# Patient Record
Sex: Male | Born: 1990 | Hispanic: No | Marital: Married
Health system: Southern US, Community
[De-identification: ages and names within clinical notes are randomized; demographics above are authoritative.]

---

## 2015-02-28 ENCOUNTER — Encounter (HOSPITAL_BASED_OUTPATIENT_CLINIC_OR_DEPARTMENT_OTHER): Payer: Self-pay | Admitting: Emergency Medicine

## 2015-02-28 ENCOUNTER — Emergency Department (HOSPITAL_BASED_OUTPATIENT_CLINIC_OR_DEPARTMENT_OTHER)

## 2015-02-28 ENCOUNTER — Emergency Department (HOSPITAL_BASED_OUTPATIENT_CLINIC_OR_DEPARTMENT_OTHER)
Admission: EM | Admit: 2015-02-28 | Discharge: 2015-02-28 | Disposition: A | Discharge status: Home / Self Care | Attending: Emergency Medicine | Admitting: Emergency Medicine

## 2015-02-28 DIAGNOSIS — Y9389 Activity, other specified: Secondary | ICD-10-CM | POA: Diagnosis not present

## 2015-02-28 DIAGNOSIS — Y991 Military activity: Secondary | ICD-10-CM | POA: Diagnosis not present

## 2015-02-28 DIAGNOSIS — Y9289 Other specified places as the place of occurrence of the external cause: Secondary | ICD-10-CM | POA: Insufficient documentation

## 2015-02-28 DIAGNOSIS — S2341XA Sprain of ribs, initial encounter: Secondary | ICD-10-CM | POA: Insufficient documentation

## 2015-02-28 DIAGNOSIS — S29001A Unspecified injury of muscle and tendon of front wall of thorax, initial encounter: Secondary | ICD-10-CM | POA: Diagnosis present

## 2015-02-28 DIAGNOSIS — X58XXXA Exposure to other specified factors, initial encounter: Secondary | ICD-10-CM | POA: Diagnosis not present

## 2015-02-28 MED ORDER — HYDROCODONE-ACETAMINOPHEN 5-325 MG PO TABS: 1.0000 | ORAL_TABLET | Freq: Four times a day (QID) | ORAL | Status: AC | PRN

## 2015-02-28 NOTE — ED Notes (Signed)
Was on ground exercising and rolled over an object, now having pain to left rib area

## 2015-02-28 NOTE — ED Provider Notes (Signed)
CSN: [id number]     Arrival date & time 02/28/15  0604 History   First MD Initiated Contact with Patient 02/28/15 0615     Chief Complaint  Patient presents with  . Rib Injury     (Consider location/radiation/quality/duration/timing/severity/associated sxs/prior Treatment) HPI  This is a 25 year old male with remote history of a left rib fracture in 2012. He believes he reinjured that rib a week and a half ago while performing physical activities as part of his [employer] duties. The pain is located in the left lower rib cage at about the anterior axillary line. The pain is well localized and sharp in nature. He rates it as a 7 out of 10 at its worst which is with activity. It is much less severe at rest.  History reviewed. No pertinent past medical history. History reviewed. No pertinent past surgical history. History reviewed. No pertinent family history. Social History  Substance Use Topics  . Smoking status: None  . Smokeless tobacco: None  . Alcohol Use: Yes    Review of Systems  All other systems reviewed and are negative.   Allergies  Review of patient's allergies indicates not on file.  Home Medications   Prior to Admission medications   Not on File   BP 129/59 mmHg  Pulse 72  Temp(Src) 97.7 F (36.5 C) (Oral)  Resp 20  Ht  (1.778 m)  Wt 168 lb (76.204 kg)  BMI 24.11 kg/m2  SpO2 99%   Physical Exam  General: Well-developed, well-nourished male in no acute distress; appearance consistent with age of record HENT: normocephalic; atraumatic Eyes: pupils equal, round and reactive to light; extraocular muscles intact Neck: supple Heart: regular rate and rhythm Lungs: clear to auscultation bilaterally Chest: Point tenderness left lower rib cage at about the anterior axillary line without deformity or crepitus Abdomen: soft; nondistended; nontender; no masses or hepatosplenomegaly; bowel sounds present Extremities: No deformity; full range of motion; pulses  normal Neurologic: Awake, alert and oriented; motor function intact in all extremities and symmetric; no facial droop Skin: Warm and dry Psychiatric: Normal mood and affect    ED Course  Procedures (including critical care time)   MDM  Nursing notes and vitals signs, including pulse oximetry, reviewed.  Summary of this visit's results, reviewed by myself:  Imaging Studies: Dg Ribs Unilateral W/chest Left  02/28/2015  CLINICAL DATA:  Left anterior lower rib pain for 2 weeks. EXAM: LEFT RIBS AND CHEST - 3+ VIEW COMPARISON:  None. FINDINGS: No fracture or other bone lesions are seen involving the ribs. There is no evidence of pneumothorax or pleural effusion. Both lungs are clear. Heart size and mediastinal contours are within normal limits. IMPRESSION: Negative. Electronically Signed   By: Ted Mcalpine M.D.   On: 02/28/2015 06:50   Suspect fracture along 9th rib costochondral junction as this is where he localizes the pain.    Paula Libra, MD 02/28/15 [phone]

## 2015-02-28 NOTE — ED Notes (Signed)
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

## 2016-07-01 IMAGING — DX DG RIBS W/ CHEST 3+V*L*
3 series · 3 of 3 positions shown · non-contrast
Comparison: None.

CLINICAL DATA: Left anterior lower rib pain for 2 weeks.

EXAM:
LEFT RIBS AND CHEST - 3+ VIEW

[chest pa]
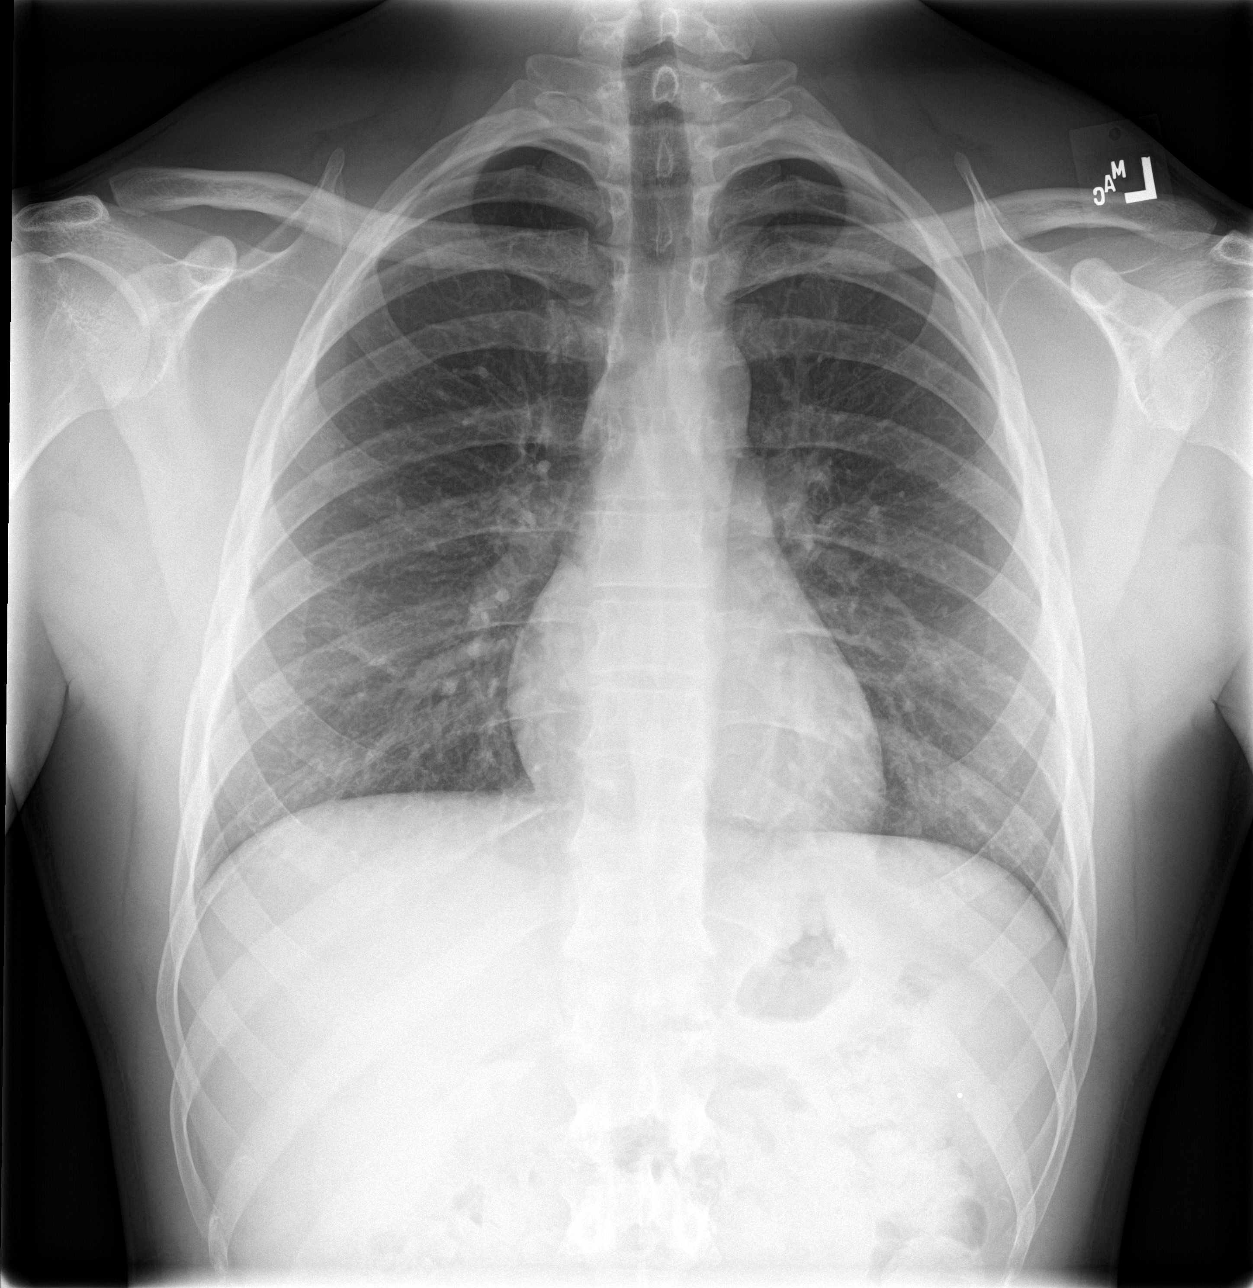

[rib pa obl]
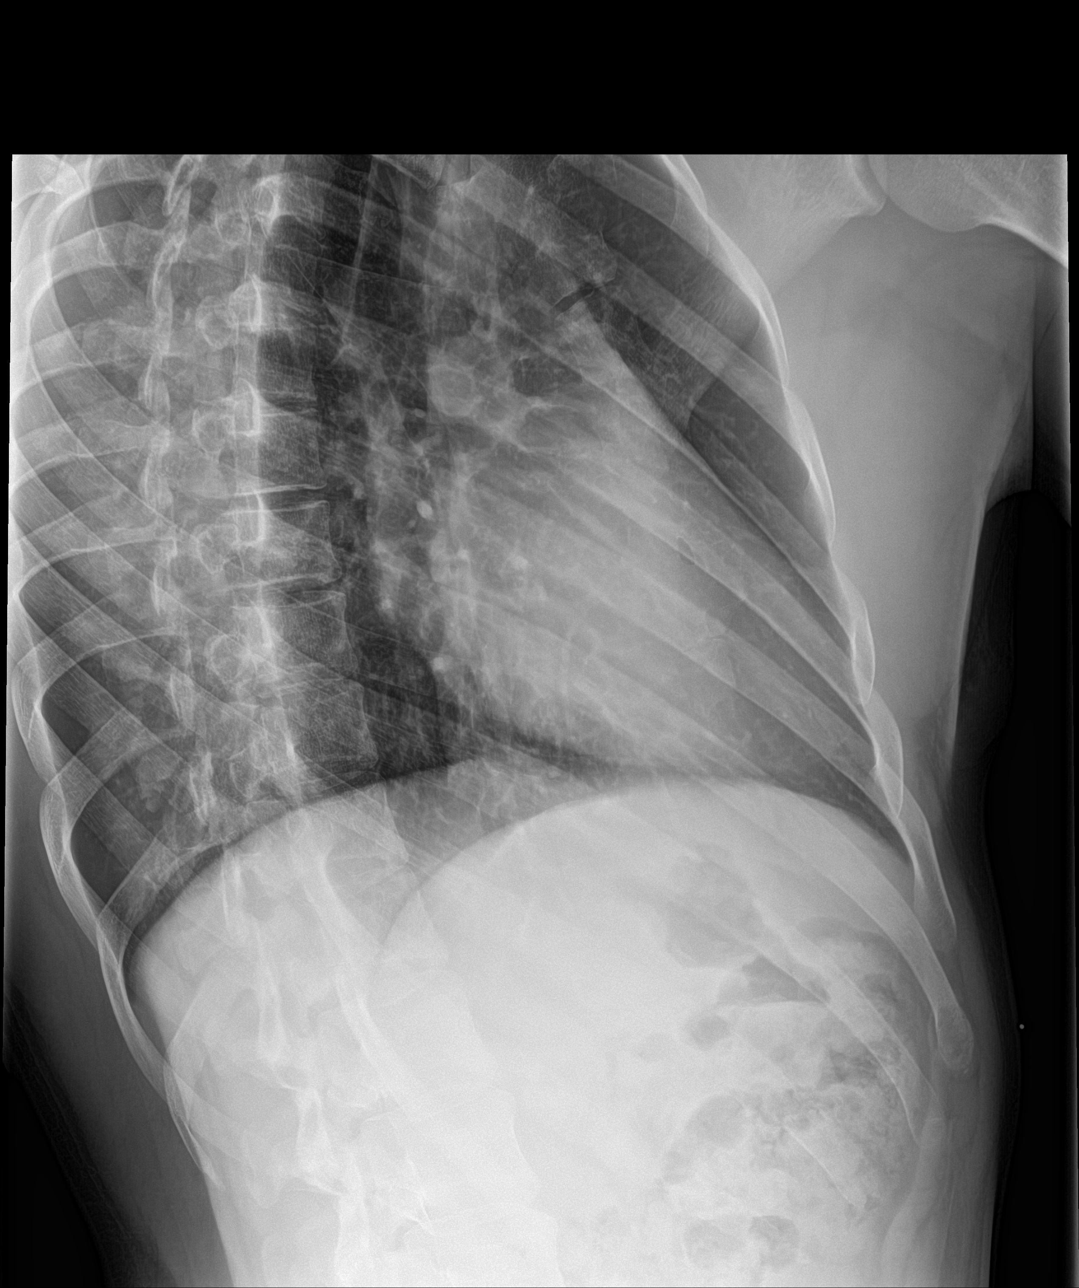

[rib pa]
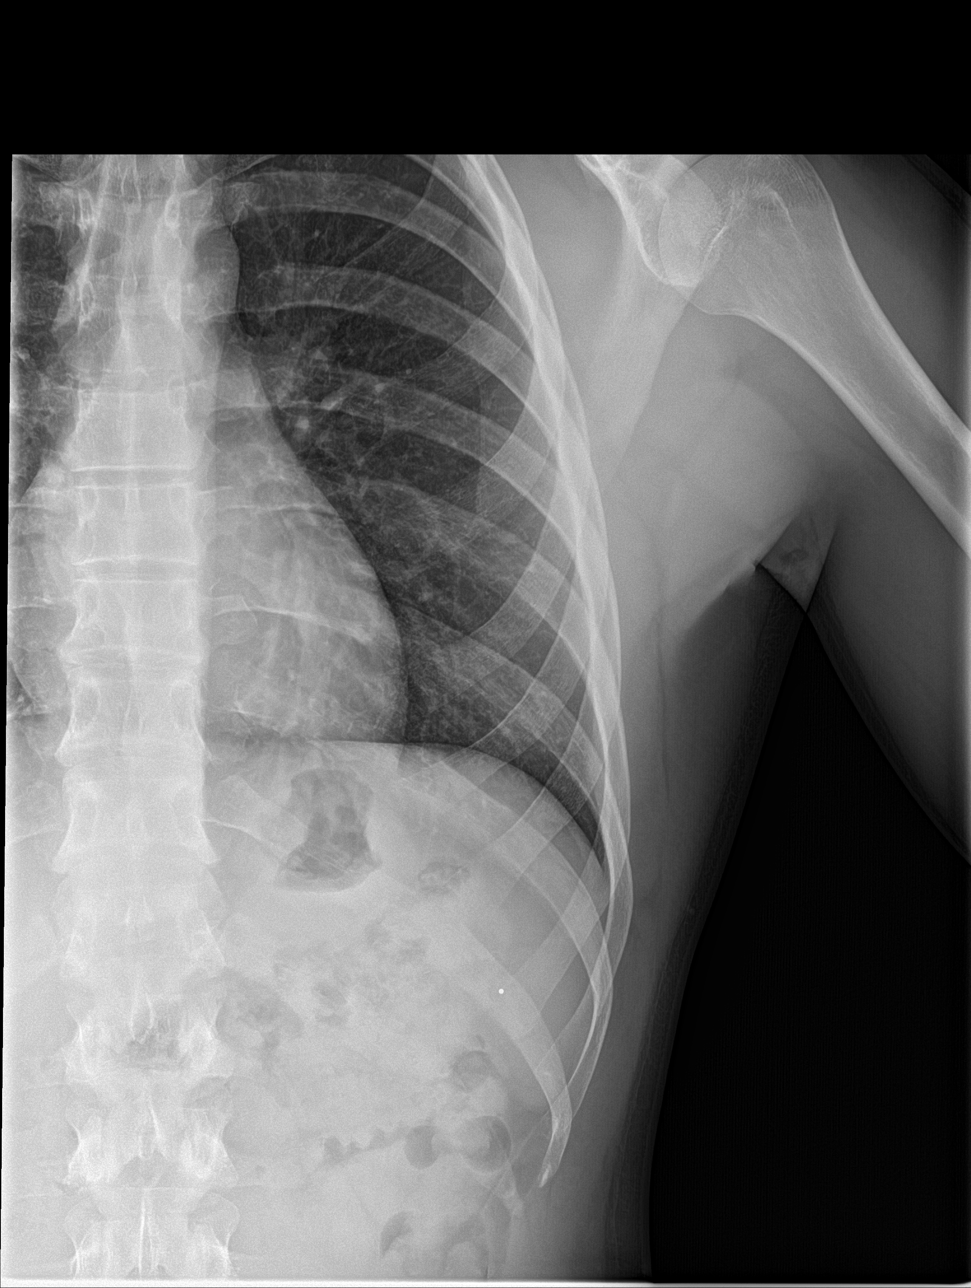

[3 of 3 positions shown; findings below may reference images not displayed]

FINDINGS: No fracture or other bone lesions are seen involving the ribs. There
is no evidence of pneumothorax or pleural effusion. Both lungs are
clear. Heart size and mediastinal contours are within normal limits.
IMPRESSION: Negative.
